# Patient Record
Sex: Male | Born: 1975 | Race: Black or African American | Hispanic: No | Marital: Single | Smoking: Never smoker
Health system: Southern US, Community
[De-identification: ages and names within clinical notes are randomized; demographics above are authoritative.]

## PROBLEM LIST (undated history)

## (undated) DIAGNOSIS — K219 Gastro-esophageal reflux disease without esophagitis: Secondary | ICD-10-CM

## (undated) DIAGNOSIS — A749 Chlamydial infection, unspecified: Secondary | ICD-10-CM

## (undated) DIAGNOSIS — T7840XA Allergy, unspecified, initial encounter: Secondary | ICD-10-CM

## (undated) DIAGNOSIS — J189 Pneumonia, unspecified organism: Secondary | ICD-10-CM

## (undated) HISTORY — DX: Allergy, unspecified, initial encounter: T78.40XA

## (undated) HISTORY — DX: Pneumonia, unspecified organism: J18.9

## (undated) HISTORY — DX: Gastro-esophageal reflux disease without esophagitis: K21.9

---

## 2009-02-03 ENCOUNTER — Emergency Department (HOSPITAL_COMMUNITY): Admission: EM | Admit: 2009-02-03 | Discharge: 2009-02-03 | Payer: Self-pay | Admitting: Family Medicine

## 2009-04-01 ENCOUNTER — Emergency Department (HOSPITAL_COMMUNITY): Admission: EM | Admit: 2009-04-01 | Discharge: 2009-04-01 | Payer: Self-pay | Admitting: Family Medicine

## 2010-03-09 ENCOUNTER — Emergency Department (HOSPITAL_COMMUNITY): Admission: EM | Admit: 2010-03-09 | Discharge: 2010-03-09 | Payer: Self-pay | Admitting: Emergency Medicine

## 2010-03-12 ENCOUNTER — Emergency Department (HOSPITAL_COMMUNITY): Admission: EM | Admit: 2010-03-12 | Discharge: 2010-03-12 | Payer: Self-pay | Admitting: Emergency Medicine

## 2010-03-14 ENCOUNTER — Encounter (INDEPENDENT_AMBULATORY_CARE_PROVIDER_SITE_OTHER): Payer: Self-pay | Admitting: Internal Medicine

## 2010-03-14 ENCOUNTER — Ambulatory Visit: Payer: Self-pay | Admitting: Cardiothoracic Surgery

## 2010-03-14 ENCOUNTER — Inpatient Hospital Stay (HOSPITAL_COMMUNITY): Admission: EM | Admit: 2010-03-14 | Discharge: 2010-03-18 | Payer: Self-pay | Admitting: Emergency Medicine

## 2010-03-14 HISTORY — PX: THORACOSCOPY: SUR1347

## 2010-03-23 ENCOUNTER — Ambulatory Visit: Payer: Self-pay | Admitting: Family Medicine

## 2010-03-25 ENCOUNTER — Ambulatory Visit: Payer: Self-pay | Admitting: Cardiothoracic Surgery

## 2010-04-30 ENCOUNTER — Encounter: Admission: RE | Admit: 2010-04-30 | Discharge: 2010-04-30 | Payer: Self-pay | Admitting: Cardiothoracic Surgery

## 2010-04-30 ENCOUNTER — Ambulatory Visit: Payer: Self-pay | Admitting: Cardiothoracic Surgery

## 2010-05-26 ENCOUNTER — Ambulatory Visit: Payer: Self-pay | Admitting: Family Medicine

## 2010-10-04 ENCOUNTER — Encounter: Payer: Self-pay | Admitting: Cardiothoracic Surgery

## 2010-11-29 LAB — AFB CULTURE WITH SMEAR (NOT AT ARMC)
Acid Fast Smear: NONE SEEN
Acid Fast Smear: NONE SEEN

## 2010-11-29 LAB — CBC
HCT: 33.8 % — ABNORMAL LOW (ref 39.0–52.0)
HCT: 34.1 % — ABNORMAL LOW (ref 39.0–52.0)
HCT: 34.7 % — ABNORMAL LOW (ref 39.0–52.0)
HCT: 35.5 % — ABNORMAL LOW (ref 39.0–52.0)
Hemoglobin: 11.4 g/dL — ABNORMAL LOW (ref 13.0–17.0)
Hemoglobin: 11.6 g/dL — ABNORMAL LOW (ref 13.0–17.0)
Hemoglobin: 11.7 g/dL — ABNORMAL LOW (ref 13.0–17.0)
Hemoglobin: 12 g/dL — ABNORMAL LOW (ref 13.0–17.0)
MCH: 31.3 pg (ref 26.0–34.0)
MCH: 31.4 pg (ref 26.0–34.0)
MCH: 31.8 pg (ref 26.0–34.0)
MCH: 32.1 pg (ref 26.0–34.0)
MCHC: 33.7 g/dL (ref 30.0–36.0)
MCHC: 33.8 g/dL (ref 30.0–36.0)
MCHC: 34.2 g/dL (ref 30.0–36.0)
MCHC: 34.5 g/dL (ref 30.0–36.0)
MCV: 92.5 fL (ref 78.0–100.0)
MCV: 92.7 fL (ref 78.0–100.0)
MCV: 93.2 fL (ref 78.0–100.0)
MCV: 93.2 fL (ref 78.0–100.0)
MCV: 93.3 fL (ref 78.0–100.0)
Platelets: 243 10*3/uL (ref 150–400)
Platelets: 263 10*3/uL (ref 150–400)
Platelets: 276 10*3/uL (ref 150–400)
RBC: 3.63 MIL/uL — ABNORMAL LOW (ref 4.22–5.81)
RBC: 3.66 MIL/uL — ABNORMAL LOW (ref 4.22–5.81)
RBC: 3.8 MIL/uL — ABNORMAL LOW (ref 4.22–5.81)
RBC: 3.84 MIL/uL — ABNORMAL LOW (ref 4.22–5.81)
RDW: 13.3 % (ref 11.5–15.5)
RDW: 13.6 % (ref 11.5–15.5)
RDW: 13.7 % (ref 11.5–15.5)
WBC: 12.7 10*3/uL — ABNORMAL HIGH (ref 4.0–10.5)
WBC: 13.1 10*3/uL — ABNORMAL HIGH (ref 4.0–10.5)
WBC: 13.4 10*3/uL — ABNORMAL HIGH (ref 4.0–10.5)
WBC: 14.6 10*3/uL — ABNORMAL HIGH (ref 4.0–10.5)
WBC: 8.8 10*3/uL (ref 4.0–10.5)

## 2010-11-29 LAB — GRAM STAIN

## 2010-11-29 LAB — COMPREHENSIVE METABOLIC PANEL
ALT: 46 U/L (ref 0–53)
AST: 40 U/L — ABNORMAL HIGH (ref 0–37)
Albumin: 2.2 g/dL — ABNORMAL LOW (ref 3.5–5.2)
Alkaline Phosphatase: 61 U/L (ref 39–117)
BUN: 14 mg/dL (ref 6–23)
CO2: 27 mEq/L (ref 19–32)
Calcium: 8.7 mg/dL (ref 8.4–10.5)
Chloride: 109 mEq/L (ref 96–112)
Creatinine, Ser: 1.31 mg/dL (ref 0.4–1.5)
GFR calc Af Amer: >60 mL/min (ref >60)
GFR calc non Af Amer: >60 mL/min (ref >60)
Glucose, Bld: 141 mg/dL — ABNORMAL HIGH (ref 70–99)
Potassium: 3.9 mEq/L (ref 3.5–5.1)
Sodium: 141 mEq/L (ref 135–145)
Total Bilirubin: 0.3 mg/dL (ref 0.3–1.2)
Total Protein: 6.7 g/dL (ref 6.0–8.3)

## 2010-11-29 LAB — BASIC METABOLIC PANEL
BUN: 11 mg/dL (ref 6–23)
BUN: 11 mg/dL (ref 6–23)
CO2: 25 mEq/L (ref 19–32)
CO2: 26 mEq/L (ref 19–32)
Calcium: 8.7 mg/dL (ref 8.4–10.5)
Calcium: 9.1 mg/dL (ref 8.4–10.5)
Chloride: 102 mEq/L (ref 96–112)
Chloride: 105 mEq/L (ref 96–112)
Chloride: 110 mEq/L (ref 96–112)
Creatinine, Ser: 1.18 mg/dL (ref 0.4–1.5)
Creatinine, Ser: 1.28 mg/dL (ref 0.4–1.5)
GFR calc Af Amer: >60 mL/min (ref >60)
GFR calc Af Amer: >60 mL/min (ref >60)
GFR calc non Af Amer: >60 mL/min (ref >60)
GFR calc non Af Amer: >60 mL/min (ref >60)
GFR calc non Af Amer: >60 mL/min (ref >60)
Glucose, Bld: 102 mg/dL — ABNORMAL HIGH (ref 70–99)
Glucose, Bld: 103 mg/dL — ABNORMAL HIGH (ref 70–99)
Glucose, Bld: 142 mg/dL — ABNORMAL HIGH (ref 70–99)
Potassium: 4 mEq/L (ref 3.5–5.1)
Potassium: 4 mEq/L (ref 3.5–5.1)
Potassium: 4.2 mEq/L (ref 3.5–5.1)
Sodium: 135 mEq/L (ref 135–145)
Sodium: 137 mEq/L (ref 135–145)
Sodium: 139 mEq/L (ref 135–145)

## 2010-11-29 LAB — URINALYSIS, ROUTINE W REFLEX MICROSCOPIC
Glucose, UA: NEGATIVE mg/dL
Hgb urine dipstick: NEGATIVE
Specific Gravity, Urine: 1.041 — ABNORMAL HIGH (ref 1.005–1.030)
pH: 5.5 (ref 5.0–8.0)

## 2010-11-29 LAB — TISSUE CULTURE
Culture: NO GROWTH
Culture: NO GROWTH

## 2010-11-29 LAB — FUNGUS CULTURE W SMEAR: Fungal Smear: NONE SEEN

## 2010-11-29 LAB — DIFFERENTIAL
Basophils Absolute: 0 10*3/uL (ref 0.0–0.1)
Eosinophils Relative: 3 % (ref 0–5)
Lymphocytes Relative: 11 % — ABNORMAL LOW (ref 12–46)
Lymphocytes Relative: 13 % (ref 12–46)
Lymphs Abs: 1.1 10*3/uL (ref 0.7–4.0)
Monocytes Absolute: 1.1 10*3/uL — ABNORMAL HIGH (ref 0.1–1.0)
Monocytes Relative: 9 % (ref 3–12)
Monocytes Relative: 9 % (ref 3–12)
Neutro Abs: 6.4 10*3/uL (ref 1.7–7.7)
Neutro Abs: 9.8 10*3/uL — ABNORMAL HIGH (ref 1.7–7.7)

## 2010-11-29 LAB — POCT I-STAT 3, ART BLOOD GAS (G3+)
Acid-Base Excess: 2 mmol/L (ref 0.0–2.0)
Bicarbonate: 26.8 mEq/L — ABNORMAL HIGH (ref 20.0–24.0)
O2 Saturation: 92 %
Patient temperature: 98.4
pO2, Arterial: 62 mmHg — ABNORMAL LOW (ref 80.0–100.0)

## 2010-11-29 LAB — CULTURE, RESPIRATORY W GRAM STAIN

## 2010-11-29 LAB — BODY FLUID CULTURE: Culture: NO GROWTH

## 2010-11-29 LAB — TYPE AND SCREEN: ABO/RH(D): B POS

## 2010-11-29 LAB — POCT I-STAT, CHEM 8
BUN: 20 mg/dL (ref 6–23)
Chloride: 106 mEq/L (ref 96–112)
Glucose, Bld: 113 mg/dL — ABNORMAL HIGH (ref 70–99)
HCT: 39 % (ref 39.0–52.0)

## 2010-11-29 LAB — VANCOMYCIN, TROUGH: Vancomycin Tr: 9.7 ug/mL — ABNORMAL LOW (ref 10.0–20.0)

## 2011-01-26 NOTE — Assessment & Plan Note (Signed)
OFFICE VISIT   MARQUET, FAIRCLOTH  DOB:  09/23/1975                                        April 30, 2010  CHART #:  [id number]   HISTORY OF PRESENT ILLNESS:  The patient is status post video  bronchoscopy with bronchial washings with left video-assisted  thoracoscopic surgery with drainage of empyema and decortication done by  Dr. Tyrone Sage, March 14, 2010.  The patient did well postoperatively and  was ultimately discharged to home in stable condition on Augmentin as  well as Percocet.  The patient presents today for followup visit.  He  feels he is progressing well.  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  He is up ambulating with assistance.  He states he does still have some  minimal incisional pain, but is not taking any narcotics for his pain.  He denies any shortness of breath, hemoptysis, or wheezing.  He states  that he did follow up with a physician that was given to him by the  hospitalist.  The patient was not satisfied with this physician, does  not think he will be returning.   PHYSICAL EXAMINATION:  Vital Signs:  Blood pressure 122/76, pulse 62,  respirations of 16, O2 sats 98% on room air, and temperature 97.4.  Respiratory:  Clear to auscultation bilaterally.  Cardiac:  Regular rate  and rhythm.  Incisions:  All incisions are healed.   STUDIES:  The patient had a PA and lateral chest x-ray obtained today  which showed improved aeration with only mild left basilar atelectasis  remaining.  All cultures have come back negative to date.   IMPRESSION AND PLAN:  The patient is progressing well postoperatively.  He is off all antibiotics and narcotics.  He is back at work.  At this  time, the patient is instructed to continue using his incentive  spirometer and increase his activity level.  He is encouraged to  continue following up with the primary care physician he was given.  At  this point, the patient is felt to be  stable from a surgical  standpoint and we will release him from the  practice.  He is told if he has any surgical issues, he is to contact  us.  The patient is in agreement.   Sheliah Plane, MD  Electronically Signed   KMD/MEDQ  D:  04/30/2010  T:  04/30/2010  Job:  [id number]

## 2011-03-25 ENCOUNTER — Encounter: Payer: Self-pay | Admitting: Family Medicine

## 2011-06-14 ENCOUNTER — Other Ambulatory Visit: Payer: Self-pay | Admitting: Internal Medicine

## 2011-06-22 ENCOUNTER — Inpatient Hospital Stay
Admission: RE | Admit: 2011-06-22 | Discharge: 2011-06-22 | Payer: Self-pay | Source: Ambulatory Visit | Attending: Internal Medicine | Admitting: Internal Medicine

## 2011-06-22 ENCOUNTER — Other Ambulatory Visit: Payer: Self-pay

## 2011-06-24 ENCOUNTER — Ambulatory Visit
Admission: RE | Admit: 2011-06-24 | Discharge: 2011-06-24 | Disposition: A | Discharge status: Home / Self Care | Payer: 59 | Source: Ambulatory Visit | Attending: Internal Medicine | Admitting: Internal Medicine

## 2011-06-24 DIAGNOSIS — R519 Headache, unspecified: Secondary | ICD-10-CM

## 2011-06-24 MED ORDER — GADOBENATE DIMEGLUMINE 529 MG/ML IV SOLN
20.0000 mL | Freq: Once | INTRAVENOUS | Status: AC | PRN
  Administered 2011-06-24: 20 mL via INTRAVENOUS

## 2011-07-27 ENCOUNTER — Encounter: Payer: Self-pay | Admitting: Gastroenterology

## 2011-08-16 ENCOUNTER — Ambulatory Visit (INDEPENDENT_AMBULATORY_CARE_PROVIDER_SITE_OTHER): Payer: 59 | Admitting: Gastroenterology

## 2011-08-16 ENCOUNTER — Encounter: Payer: Self-pay | Admitting: Gastroenterology

## 2011-08-16 VITALS — BP 134/78 | HR 88 | Ht 72.0 in | Wt 229.0 lb

## 2011-08-16 DIAGNOSIS — K219 Gastro-esophageal reflux disease without esophagitis: Secondary | ICD-10-CM

## 2011-08-16 MED ORDER — OMEPRAZOLE MAGNESIUM 20 MG PO TBEC: 20.0000 mg | DELAYED_RELEASE_TABLET | Freq: Every day | ORAL | Status: DC

## 2011-08-16 NOTE — Progress Notes (Signed)
History of Present Illness: This is a 35 year old male who relates a several year history of frequent heartburn and indigestion. He states he took Prilosec occasionally up until about 2 years ago and since then he has needed Prilosec on a daily basis to control symptoms. He is unable to come off medication. He would like a permanent solution to his reflux problems without medication. Denies weight loss, abdominal pain, constipation, diarrhea, change in stool caliber, melena, hematochezia, nausea, vomiting, dysphagia,  chest pain.  Review of Systems: Pertinent positive and negative review of systems were noted in the above HPI section. All other review of systems were otherwise negative.  Current Medications, Allergies, Past Medical History, Past Surgical History, Family History and Social History were reviewed in [employer] record.  Physical Exam: General: Well developed , well nourished, no acute distress Head: Normocephalic and atraumatic Eyes:  sclerae anicteric, EOMI Ears: Normal auditory acuity Mouth: No deformity or lesions Neck: Supple, no masses or thyromegaly Lungs: Clear throughout to auscultation Heart: Regular rate and rhythm; no murmurs, rubs or bruits Abdomen: Soft, non tender and non distended. No masses, hepatosplenomegaly or hernias noted. Normal Bowel sounds Musculoskeletal: Symmetrical with no gross deformities  Skin: No lesions on visible extremities Pulses:  Normal pulses noted Extremities: No clubbing, cyanosis, edema or deformities noted Neurological: Alert oriented x 4, grossly nonfocal Cervical Nodes:  No significant cervical adenopathy Inguinal Nodes: No significant inguinal adenopathy Psychological:  Alert and cooperative. Normal mood and affect  Assessment and Recommendations:  1. GERD. Begin all standard anti-reflux measures, use 4 inch blocks and continue omeprazole 20 mg daily for now. He is advised that weight loss may also help to  control reflux. Further evaluation for erosive esophagitis, Barrett's esophagus, ulcer disease and other disorders with upper endoscopy. We discussed the long-term management of reflux and options for surgical treatment as well.

## 2011-08-16 NOTE — Patient Instructions (Signed)
You have been scheduled for a Upper Endoscopy. See separate instructions.  A prescription for omeprazole has been sent to the pharmacy.  Patient advised to avoid spicy, acidic, citrus, chocolate, mints, fruit and fruit juices.  Limit the intake of caffeine, alcohol and Soda.  Don't exercise too soon after eating.  Don't lie down within 3-4 hours of eating.  Elevate the head of your bed.

## 2011-09-21 ENCOUNTER — Encounter: Payer: Self-pay | Admitting: Gastroenterology

## 2011-09-21 ENCOUNTER — Ambulatory Visit (AMBULATORY_SURGERY_CENTER): Payer: 59 | Admitting: Gastroenterology

## 2011-09-21 VITALS — BP 132/54 | HR 64 | Temp 96.6°F | Resp 15 | Ht 72.0 in | Wt 229.0 lb

## 2011-09-21 DIAGNOSIS — K2970 Gastritis, unspecified, without bleeding: Secondary | ICD-10-CM

## 2011-09-21 DIAGNOSIS — K219 Gastro-esophageal reflux disease without esophagitis: Secondary | ICD-10-CM

## 2011-09-21 DIAGNOSIS — D131 Benign neoplasm of stomach: Secondary | ICD-10-CM

## 2011-09-21 MED ORDER — SODIUM CHLORIDE 0.9 % IV SOLN: 500.0000 mL | INTRAVENOUS | Status: DC

## 2011-09-21 MED ORDER — MAGIC MOUTHWASH: 10.0000 mL | Freq: Four times a day (QID) | ORAL | Status: DC

## 2011-09-21 NOTE — Progress Notes (Signed)
Patient did not experience any of the following events: a burn prior to discharge; a fall within the facility; wrong site/side/patient/procedure/implant event; or a hospital transfer or hospital admission upon discharge from the facility. (G8907) Patient did not have preoperative order for IV antibiotic SSI prophylaxis. (G8918)  

## 2011-09-21 NOTE — Op Note (Signed)
[facility] [address] [place], [place]  [address]  ENDOSCOPY PROCEDURE REPORT  PATIENT:  Douglas, Bell  MR#:  [id number] BIRTHDATE:  04/17/1976, 35 yrs. old  GENDER:  male ENDOSCOPIST:  Judie Petit T. Russella Dar, MD, [facility]  PROCEDURE DATE:  09/21/2011 PROCEDURE:  EGD with biopsy, 19147 ASA CLASS:  Class II INDICATIONS:  GERD MEDICATIONS:  MAC sedation, administered by CRNA, propofol (Diprivan) 300 mg IV TOPICAL ANESTHETIC:  none DESCRIPTION OF PROCEDURE:   After the risks benefits and alternatives of the procedure were thoroughly explained, informed consent was obtained.  The LB GIF-H180 [id number] endoscope was introduced through the mouth and advanced to the second portion of the duodenum, without limitations.  The instrument was slowly withdrawn as the mucosa was fully examined. <<PROCEDUREIMAGES>> Moderate gastritis was found in the body and the fundus of the stomach. It was erythematous and granular. Biopsies of the fundus and body of the stomach were obtained and sent to pathology. Otherwise normal stomach.  The esophagus and gastroesophageal junction were completely normal in appearance.  The duodenal bulb was normal in appearance, as was the postbulbar duodenum. Retroflexed views revealed a hiatal hernia, 4 cm.  The scope was then withdrawn from the patient and the procedure completed. COMPLICATIONS:  None  ENDOSCOPIC IMPRESSION: 1) Moderate gastritis 2) A hiatal hernia  RECOMMENDATIONS: 1) Anti-reflux regimen 2) Await pathology results 3) PPI qam 4) Magic mouthwash swish and swallow qid for 10 days  Malcolm T. Russella Dar, MD, [facility]  n. eSIGNED:   Venita Lick. Stark at 09/21/2011 11:18 AM  [place], Lum Babe, [phone]

## 2011-09-22 ENCOUNTER — Telehealth: Payer: Self-pay | Admitting: *Deleted

## 2011-09-22 NOTE — Telephone Encounter (Signed)

## 2011-09-27 ENCOUNTER — Encounter: Payer: Self-pay | Admitting: Gastroenterology

## 2012-03-30 ENCOUNTER — Ambulatory Visit (INDEPENDENT_AMBULATORY_CARE_PROVIDER_SITE_OTHER): Payer: 59 | Admitting: Physician Assistant

## 2012-03-30 VITALS — BP 132/92 | HR 88 | Temp 98.4°F | Resp 18 | Ht 71.0 in | Wt 230.0 lb

## 2012-03-30 DIAGNOSIS — K122 Cellulitis and abscess of mouth: Secondary | ICD-10-CM

## 2012-03-30 DIAGNOSIS — J029 Acute pharyngitis, unspecified: Secondary | ICD-10-CM

## 2012-03-30 LAB — POCT RAPID STREP A (OFFICE): Rapid Strep A Screen: NEGATIVE

## 2012-03-30 MED ORDER — MAGIC MOUTHWASH W/LIDOCAINE: ORAL | Status: DC

## 2012-03-30 MED ORDER — IBUPROFEN 200 MG PO TABS
800.0000 mg | ORAL_TABLET | Freq: Once | ORAL | Status: AC
  Administered 2012-03-30: 800 mg via ORAL

## 2012-03-30 NOTE — Progress Notes (Signed)
  Subjective:    Patient ID: Douglas Bell, male    DOB: 1975-11-26, 36 y.o.   MRN: [id number]  HPI  Pt presents to clinic with 2 day h/o sore throat.  Started yesterday and seems to be worse today.  No other cold symptoms.  He does have a cough but it is because his feels like something is choking/tickling him.  His throat is red and his uvula appears swollen to him.  Review of Systems  Constitutional: Negative for fever and chills.  HENT: Positive for sore throat. Negative for ear pain, congestion, rhinorrhea and postnasal drip.   Respiratory: Positive for cough.   Cardiovascular: Negative for chest pain.  Gastrointestinal: Negative for nausea, vomiting and diarrhea.       Objective:   Physical Exam  Nursing note and vitals reviewed. Constitutional: He is oriented to person, place, and time. He appears well-developed and well-nourished.  HENT:  Head: Normocephalic and atraumatic.  Right Ear: Hearing, tympanic membrane, external ear and ear canal normal.  Left Ear: Hearing, tympanic membrane, external ear and ear canal normal.  Nose: Nose normal.  Mouth/Throat: Uvula is midline and oropharynx is clear and moist. Uvula swelling (elongated and edematous) present.  Eyes: Conjunctivae are normal.  Neck: Neck supple.  Cardiovascular: Normal rate, regular rhythm and normal heart sounds.  Exam reveals no gallop and no friction rub.   No murmur heard. Pulmonary/Chest: Effort normal and breath sounds normal.  Lymphadenopathy:    He has no cervical adenopathy.  Neurological: He is alert and oriented to person, place, and time.  Skin: Skin is warm and dry.  Psychiatric: He has a normal mood and affect. His behavior is normal. Judgment and thought content normal.    Results for orders placed in visit on 03/30/12  POCT RAPID STREP A (OFFICE)      Component Value Range   Rapid Strep A Screen Negative  Negative         Assessment & Plan:   1. Acute pharyngitis  POCT rapid strep A,  ibuprofen (ADVIL,MOTRIN) tablet 800 mg  2. Uvulitis     Gave Dukes MMW for pain.

## 2012-07-25 ENCOUNTER — Ambulatory Visit (INDEPENDENT_AMBULATORY_CARE_PROVIDER_SITE_OTHER): Payer: 59 | Admitting: Family Medicine

## 2012-07-25 ENCOUNTER — Ambulatory Visit: Payer: 59

## 2012-07-25 VITALS — BP 128/88 | HR 78 | Temp 98.3°F | Resp 18 | Ht 71.0 in | Wt 233.8 lb

## 2012-07-25 DIAGNOSIS — M79601 Pain in right arm: Secondary | ICD-10-CM

## 2012-07-25 DIAGNOSIS — M79609 Pain in unspecified limb: Secondary | ICD-10-CM

## 2012-07-25 MED ORDER — MELOXICAM 7.5 MG PO TABS: 7.5000 mg | ORAL_TABLET | Freq: Every day | ORAL | Status: DC

## 2012-07-25 NOTE — Progress Notes (Signed)
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 He was lifting some heavy quit yesterday and felt burning and swelling in his right forearm, his dominant side. He's not had this before. There is no crush injury aspect of this and he's had no bruising. He has full range of motion but whenever he starts lift anything heavier than 20 or 30 pounds he gets a burning sensation and the swelling in the proximal volar right forearm.  Objective: No acute distress, full range of motion of the right arm There is some swelling in the proximal right forearm with no point tenderness. He has full range of motion of his hand and wrist. His normal sensation in the right arm. [facility] reading (PRIMARY) by  Dr. Reubin Milan right forearm  Assessment: Right forearm strain  Plan: Given note for work for light duty, Mobic 7.5 daily, recheck in 2 weeks.

## 2012-07-28 ENCOUNTER — Telehealth: Payer: Self-pay

## 2012-07-28 NOTE — Telephone Encounter (Signed)
Pt is not feeling any better and would like to let dr know so a rx can be called into the pharmacy [facility] [address] [phone]

## 2012-07-28 NOTE — Telephone Encounter (Signed)
Dr Milus Glazier, do you want to Rx something else for pt, or have other instr's for him?

## 2012-07-30 ENCOUNTER — Other Ambulatory Visit: Payer: Self-pay | Admitting: Family Medicine

## 2012-07-30 DIAGNOSIS — M79603 Pain in arm, unspecified: Secondary | ICD-10-CM

## 2012-07-30 MED ORDER — PREDNISONE 20 MG PO TABS: 40.0000 mg | ORAL_TABLET | Freq: Every day | ORAL | Status: DC

## 2012-07-30 NOTE — Telephone Encounter (Signed)
Prednisone has been called in as a second approach to remedy the arm pain

## 2012-07-31 ENCOUNTER — Telehealth: Payer: Self-pay

## 2012-07-31 NOTE — Telephone Encounter (Signed)
Called patient, he was given Meloxicam, then prednisone after calling to advise this was not helping. I have advised him to return to clinic. He will come in tomorrow am for a recheck.

## 2012-07-31 NOTE — Telephone Encounter (Signed)
Pt is wanting to talk with someone about his medication it is not working   [employer] number [phone]

## 2012-08-01 ENCOUNTER — Ambulatory Visit (INDEPENDENT_AMBULATORY_CARE_PROVIDER_SITE_OTHER): Payer: 59 | Admitting: Internal Medicine

## 2012-08-01 VITALS — BP 126/78 | HR 89 | Temp 98.7°F | Resp 16 | Ht 71.0 in | Wt 234.0 lb

## 2012-08-01 DIAGNOSIS — S46911A Strain of unspecified muscle, fascia and tendon at shoulder and upper arm level, right arm, initial encounter: Secondary | ICD-10-CM

## 2012-08-01 DIAGNOSIS — M79609 Pain in unspecified limb: Secondary | ICD-10-CM

## 2012-08-01 DIAGNOSIS — IMO0002 Reserved for concepts with insufficient information to code with codable children: Secondary | ICD-10-CM

## 2012-08-01 MED ORDER — HYDROCODONE-ACETAMINOPHEN 5-325 MG PO TABS: 1.0000 | ORAL_TABLET | Freq: Four times a day (QID) | ORAL | Status: DC | PRN

## 2012-08-01 NOTE — Progress Notes (Signed)
  Subjective:    Patient ID: Douglas Bell, male    DOB: 02-13-1976, 36 y.o.   MRN: [id number]  HPI Right forearm injury/strain. XR neg. 1 week injury, on light duty. Arm throbs at night.  Review of Systems     Objective:   Physical Exam Tender right forearm with resistance, good strength. NMSV fully intact Normal anatomy       Assessment & Plan:  [facility] carefully Further w/up if not 100% in 1 week

## 2012-08-23 ENCOUNTER — Other Ambulatory Visit: Payer: Self-pay

## 2012-08-23 MED ORDER — OMEPRAZOLE MAGNESIUM 20 MG PO TBEC: 20.0000 mg | DELAYED_RELEASE_TABLET | Freq: Every day | ORAL | Status: DC

## 2014-01-13 ENCOUNTER — Emergency Department (HOSPITAL_COMMUNITY)
Admission: EM | Admit: 2014-01-13 | Discharge: 2014-01-13 | Disposition: A | Discharge status: Home / Self Care | Payer: Managed Care, Other (non HMO) | Source: Home / Self Care | Attending: Family Medicine | Admitting: Family Medicine

## 2014-01-13 ENCOUNTER — Other Ambulatory Visit (HOSPITAL_COMMUNITY)
Admission: RE | Admit: 2014-01-13 | Discharge: 2014-01-13 | Disposition: A | Discharge status: Home / Self Care | Payer: Managed Care, Other (non HMO) | Source: Ambulatory Visit | Attending: Family Medicine | Admitting: Family Medicine

## 2014-01-13 ENCOUNTER — Encounter (HOSPITAL_COMMUNITY): Payer: Self-pay | Admitting: Emergency Medicine

## 2014-01-13 DIAGNOSIS — N342 Other urethritis: Secondary | ICD-10-CM

## 2014-01-13 DIAGNOSIS — Z113 Encounter for screening for infections with a predominantly sexual mode of transmission: Secondary | ICD-10-CM | POA: Insufficient documentation

## 2014-01-13 HISTORY — DX: Chlamydial infection, unspecified: A74.9

## 2014-01-13 MED ORDER — AZITHROMYCIN 250 MG PO TABS
1000.0000 mg | ORAL_TABLET | Freq: Once | ORAL | Status: AC
  Administered 2014-01-13: 1000 mg via ORAL

## 2014-01-13 MED ORDER — CEFTRIAXONE SODIUM 250 MG IJ SOLR
INTRAMUSCULAR | Status: AC
  Filled 2014-01-13: qty 250

## 2014-01-13 MED ORDER — LIDOCAINE HCL (PF) 1 % IJ SOLN
INTRAMUSCULAR | Status: AC
  Filled 2014-01-13: qty 5

## 2014-01-13 MED ORDER — CEFTRIAXONE SODIUM 250 MG IJ SOLR
250.0000 mg | Freq: Once | INTRAMUSCULAR | Status: AC
  Administered 2014-01-13: 250 mg via INTRAMUSCULAR

## 2014-01-13 MED ORDER — AZITHROMYCIN 250 MG PO TABS
ORAL_TABLET | ORAL | Status: AC
  Filled 2014-01-13: qty 4

## 2014-01-13 NOTE — Discharge Instructions (Signed)
Thank you for coming in today. I recommend HIV and syphilis testing at the health department in the near future.  Urethritis, Adult Urethritis is an inflammation of the tube through which urine exits your bladder (urethra).  CAUSES Urethritis is often caused by an infection in your urethra. The infection can be viral, like herpes. The infection can also be bacterial, like gonorrhea. RISK FACTORS Risk factors of urethritis include:  Having sex without using a condom.  Having multiple sexual partners.  Having poor hygiene. SIGNS AND SYMPTOMS Symptoms of urethritis are less noticeable in women than in men. These symptoms include:  Burning feeling when you urinate (dysuria).  Discharge from your urethra.  Blood in your urine (hematuria).  Urinating more than usual. DIAGNOSIS  To confirm a diagnosis of urethritis, your health care provider will do the following:  Ask about your sexual history.  Perform a physical exam.  Have you provide a sample of your urine for lab testing.  Use a cotton swab to gently collect a sample from your urethra for lab testing. TREATMENT  It is important to treat urethritis. Depending on the cause, untreated urethritis may lead to serious genital infections and possibly infertility. Urethritis caused by a bacterial infection is treated with antibiotics. All sexual partners must be treated.  HOME CARE INSTRUCTIONS  Do not have sex until the test results are known and treatment is completed, even if your symptoms go away before you finish treatment.  Finish all medicines that you are prescribed. SEEK MEDICAL CARE IF:   Your symptoms are not improved in 3 days.  Your symptoms are getting worse.  You develop abdominal pain or pelvic pain (in women).  You develop joint pain. SEEK IMMEDIATE MEDICAL CARE IF:   You have a fever with a temperature of 101.40F (38.8C) or greater.  You have severe pain in the belly, back, or side.  You have  repeated vomiting. Document Released: 02/23/2001 Document Revised: 06/20/2013 Document Reviewed: 04/30/2013 [facility] Patient Information 2014 [place].

## 2014-01-13 NOTE — ED Notes (Signed)
No S/S allergic reaction. 

## 2014-01-13 NOTE — ED Provider Notes (Signed)
Douglas Bell is a 38 y.o. male who presents to Urgent Care today for penile discharge. Patient was exposed to an STD about a week ago when a condom broke. He has penile discharge started a few days ago. He has some pain with urination. He denies any fevers chills nausea vomiting or diarrhea. Symptoms consistent with prior episodes of Chlamydia.   Past Medical History  Diagnosis Date  . Allergy     RHINITIS  . Pneumonia   . GERD (gastroesophageal reflux disease)   . Chlamydia    History  Substance Use Topics  . Smoking status: Never Smoker   . Smokeless tobacco: Never Used     Comment: Cigar on occ per patient   . Alcohol Use: No   ROS as above Medications: Current Facility-Administered Medications  Medication Dose Route Frequency Provider Last Rate Last Dose  . azithromycin (ZITHROMAX) tablet 1,000 mg  1,000 mg Oral Once Gregor Hams, MD      . cefTRIAXone (ROCEPHIN) injection 250 mg  250 mg Intramuscular Once Gregor Hams, MD       Current Outpatient Prescriptions  Medication Sig Dispense Refill  . Alum & Mag Hydroxide-Simeth (MAGIC MOUTHWASH W/LIDOCAINE) SOLN 1 tsp po q1-2h prn sore throat - 1:1 mixture with lidocaine  120 mL  0  . HYDROcodone-acetaminophen (NORCO/VICODIN) 5-325 MG per tablet Take 1 tablet by mouth every 6 (six) hours as needed for pain.  30 tablet  0  . meloxicam (MOBIC) 7.5 MG tablet Take 1 tablet (7.5 mg total) by mouth daily.  30 tablet  0  . omeprazole (PRILOSEC OTC) 20 MG tablet Take 1 tablet (20 mg total) by mouth daily.  30 tablet  0  . predniSONE (DELTASONE) 20 MG tablet Take 2 tablets (40 mg total) by mouth daily.  10 tablet  0    Exam:  BP 144/96  Pulse 93  Temp(Src) 99.7 F (37.6 C) (Oral)  Resp 18  SpO2 100% Gen: Well NAD Genitals: No inguinal lymphadenopathy. Penis without lesions normal-appearing with small amount of clear discharge present. Testicles are descended bilaterally and nontender without masses palpated.   No results found for  this or any previous visit (from the past 24 hour(s)). No results found.  Assessment and Plan: 38 y.o. male with urethritis. Empiric treatment with ceftriaxone and azithromycin. Cytology pending. Recommend HIV and RPR at health Department in the near future  Discussed warning signs or symptoms. Please see discharge instructions. Patient expresses understanding.    Gregor Hams, MD 01/13/14 [phone]

## 2014-01-13 NOTE — ED Notes (Signed)
Reports having unprotected intercourse while out of town last weekend.  Started with testicular pain 3 days ago and penile discharge 1-2 days ago.

## 2014-01-14 LAB — URINE CYTOLOGY ANCILLARY ONLY
Chlamydia: NEGATIVE
NEISSERIA GONORRHEA: NEGATIVE
Trichomonas: NEGATIVE

## 2014-01-17 ENCOUNTER — Emergency Department (INDEPENDENT_AMBULATORY_CARE_PROVIDER_SITE_OTHER)
Admission: EM | Admit: 2014-01-17 | Discharge: 2014-01-17 | Disposition: A | Discharge status: Home / Self Care | Payer: Managed Care, Other (non HMO) | Source: Home / Self Care | Attending: Family Medicine | Admitting: Family Medicine

## 2014-01-17 ENCOUNTER — Encounter (HOSPITAL_COMMUNITY): Payer: Self-pay | Admitting: Emergency Medicine

## 2014-01-17 DIAGNOSIS — R300 Dysuria: Secondary | ICD-10-CM

## 2014-01-17 LAB — GLUCOSE, CAPILLARY: Glucose-Capillary: 135 mg/dL — ABNORMAL HIGH (ref 70–99)

## 2014-01-17 NOTE — ED Notes (Signed)
Pt  Reports  Symptoms    Of  Burning  On  Urination       Seen    Last  Sunday  And  Was  Given  meds      Pt   Reports  Still  Burns  When  Urinates

## 2014-01-17 NOTE — ED Provider Notes (Signed)
CSN: [id number]     Arrival date & time 01/17/14  5361 History   First MD Initiated Contact with Patient 01/17/14 [phone]     Chief Complaint  Patient presents with  . Follow-up   (Consider location/radiation/quality/duration/timing/severity/associated sxs/prior Treatment) HPI Comments: Was seen for same 01-13-2014 and treated with 1 gram azithromycin and 250mg  of ceftriaxone. Denies any new, additional or associated symptoms. Denies polyuria, hematuria, polydipsia or genital lesions.  PCP: none  Patient is a 38 y.o. male presenting with dysuria. The history is provided by the patient.  Dysuria This is a new problem. Episode onset: 5 days. The problem occurs daily. The problem has not changed since onset.Associated symptoms comments: none.    Past Medical History  Diagnosis Date  . Allergy     RHINITIS  . Pneumonia   . GERD (gastroesophageal reflux disease)   . Chlamydia    Past Surgical History  Procedure Laterality Date  . Thoracoscopy  03/14/2010   Family History  Problem Relation Age of Onset  . Colon cancer Neg Hx    History  Substance Use Topics  . Smoking status: Never Smoker   . Smokeless tobacco: Never Used     Comment: Cigar on occ per patient   . Alcohol Use: No    Review of Systems  Genitourinary: Positive for dysuria.  All other systems reviewed and are negative.   Allergies  Review of patient's allergies indicates no known allergies.  Home Medications   Prior to Admission medications   Not on File   BP 138/86  Pulse 78  Temp(Src) 98.6 F (37 C) (Oral)  Resp 14  SpO2 100% Physical Exam  Nursing note and vitals reviewed. Constitutional: He is oriented to person, place, and time. He appears well-developed and well-nourished.  HENT:  Head: Normocephalic and atraumatic.  Eyes: Conjunctivae are normal.  Cardiovascular: Normal rate, regular rhythm and normal heart sounds.   Pulmonary/Chest: Effort normal and breath sounds normal.  Abdominal: Soft.  Bowel sounds are normal. He exhibits no distension. There is no tenderness. There is no CVA tenderness.  Genitourinary:  Declined examination of his genitalia  Musculoskeletal: Normal range of motion.  Neurological: He is alert and oriented to person, place, and time.  Skin: Skin is warm and dry. No rash noted.  Psychiatric: He has a normal mood and affect. His behavior is normal.    ED Course  Procedures (including critical care time) Labs Review Labs Reviewed  GLUCOSE, CAPILLARY - Abnormal; Notable for the following:    Glucose-Capillary 135 (*)    All other components within normal limits  URINE CULTURE    Imaging Review No results found.   MDM   1. Dysuria   CBG 135 Urine culture sent Results reviewed from 01/13/2014 visit and patient's urine cytology negative for Gonorrhea, Chlamydia and Trichomonas. Was treated empirically with azithromycin and ceftriaxone at time of visit on 01-13-2014.  Advised patient that if symptoms persist, he may wish to consider follow up evaluation with urologist. Referred to Dr. Janice Norrie.    [place], [place] 01/17/14 [phone]

## 2014-01-17 NOTE — Discharge Instructions (Signed)
If symptoms persist, please follow up with the urologist listed on your discharge paperwork.  Dysuria Dysuria is the medical term for pain with urination. There are many causes for dysuria, but urinary tract infection is the most common. If a urinalysis was performed it can show that there is a urinary tract infection. A urine culture confirms that you or your child is sick. You will need to follow up with a healthcare provider because:  If a urine culture was done you will need to know the culture results and treatment recommendations.  If the urine culture was positive, you or your child will need to be put on antibiotics or know if the antibiotics prescribed are the right antibiotics for your urinary tract infection.  If the urine culture is negative (no urinary tract infection), then other causes may need to be explored or antibiotics need to be stopped. Today laboratory work may have been done and there does not seem to be an infection. If cultures were done they will take at least 24 to 48 hours to be completed. Today x-rays may have been taken and they read as normal. No cause can be found for the problems. The x-rays may be re-read by a radiologist and you will be contacted if additional findings are made. You or your child may have been put on medications to help with this problem until you can see your primary caregiver. If the problems get better, see your primary caregiver if the problems return. If you were given antibiotics (medications which kill germs), take all of the mediations as directed for the full course of treatment.  If laboratory work was done, you need to find the results. Leave a telephone number where you can be reached. If this is not possible, make sure you find out how you are to get test results. HOME CARE INSTRUCTIONS   Drink lots of fluids. For adults, drink eight, 8 ounce glasses of clear juice or water a day. For children, replace fluids as suggested by your  caregiver.  Empty the bladder often. Avoid holding urine for long periods of time.  After a bowel movement, women should cleanse front to back, using each tissue only once.  Empty your bladder before and after sexual intercourse.  Take all the medicine given to you until it is gone. You may feel better in a few days, but TAKE ALL MEDICINE.  Avoid caffeine, tea, alcohol and carbonated beverages, because they tend to irritate the bladder.  In men, alcohol may irritate the prostate.  Only take over-the-counter or prescription medicines for pain, discomfort, or fever as directed by your caregiver.  If your caregiver has given you a follow-up appointment, it is very important to keep that appointment. Not keeping the appointment could result in a chronic or permanent injury, pain, and disability. If there is any problem keeping the appointment, you must call back to this facility for assistance. SEEK IMMEDIATE MEDICAL CARE IF:   Back pain develops.  A fever develops.  There is nausea (feeling sick to your stomach) or vomiting (throwing up).  Problems are no better with medications or are getting worse. MAKE SURE YOU:   Understand these instructions.  Will watch your condition.  Will get help right away if you are not doing well or get worse. Document Released: 05/28/2004 Document Revised: 11/22/2011 Document Reviewed: 04/04/2008 [facility] Patient Information 2014 [place].

## 2014-01-18 LAB — URINE CULTURE
CULTURE: NO GROWTH
Colony Count: NO GROWTH

## 2014-01-18 NOTE — ED Provider Notes (Signed)
Medical screening examination/treatment/procedure(s) were performed by a resident physician or non-physician practitioner and as the supervising physician I was immediately available for consultation/collaboration.  Lynne Leader, MD    Gregor Hams, MD 01/18/14 [phone]

## 2014-02-02 IMAGING — CR DG FOREARM 2V*R*
2 series · 2 of 2 positions shown · non-contrast
Comparison: Preliminary reading of Dr. Gable

CLINICAL DATA: Arm pain

RIGHT FOREARM - 2 VIEW

[AP]
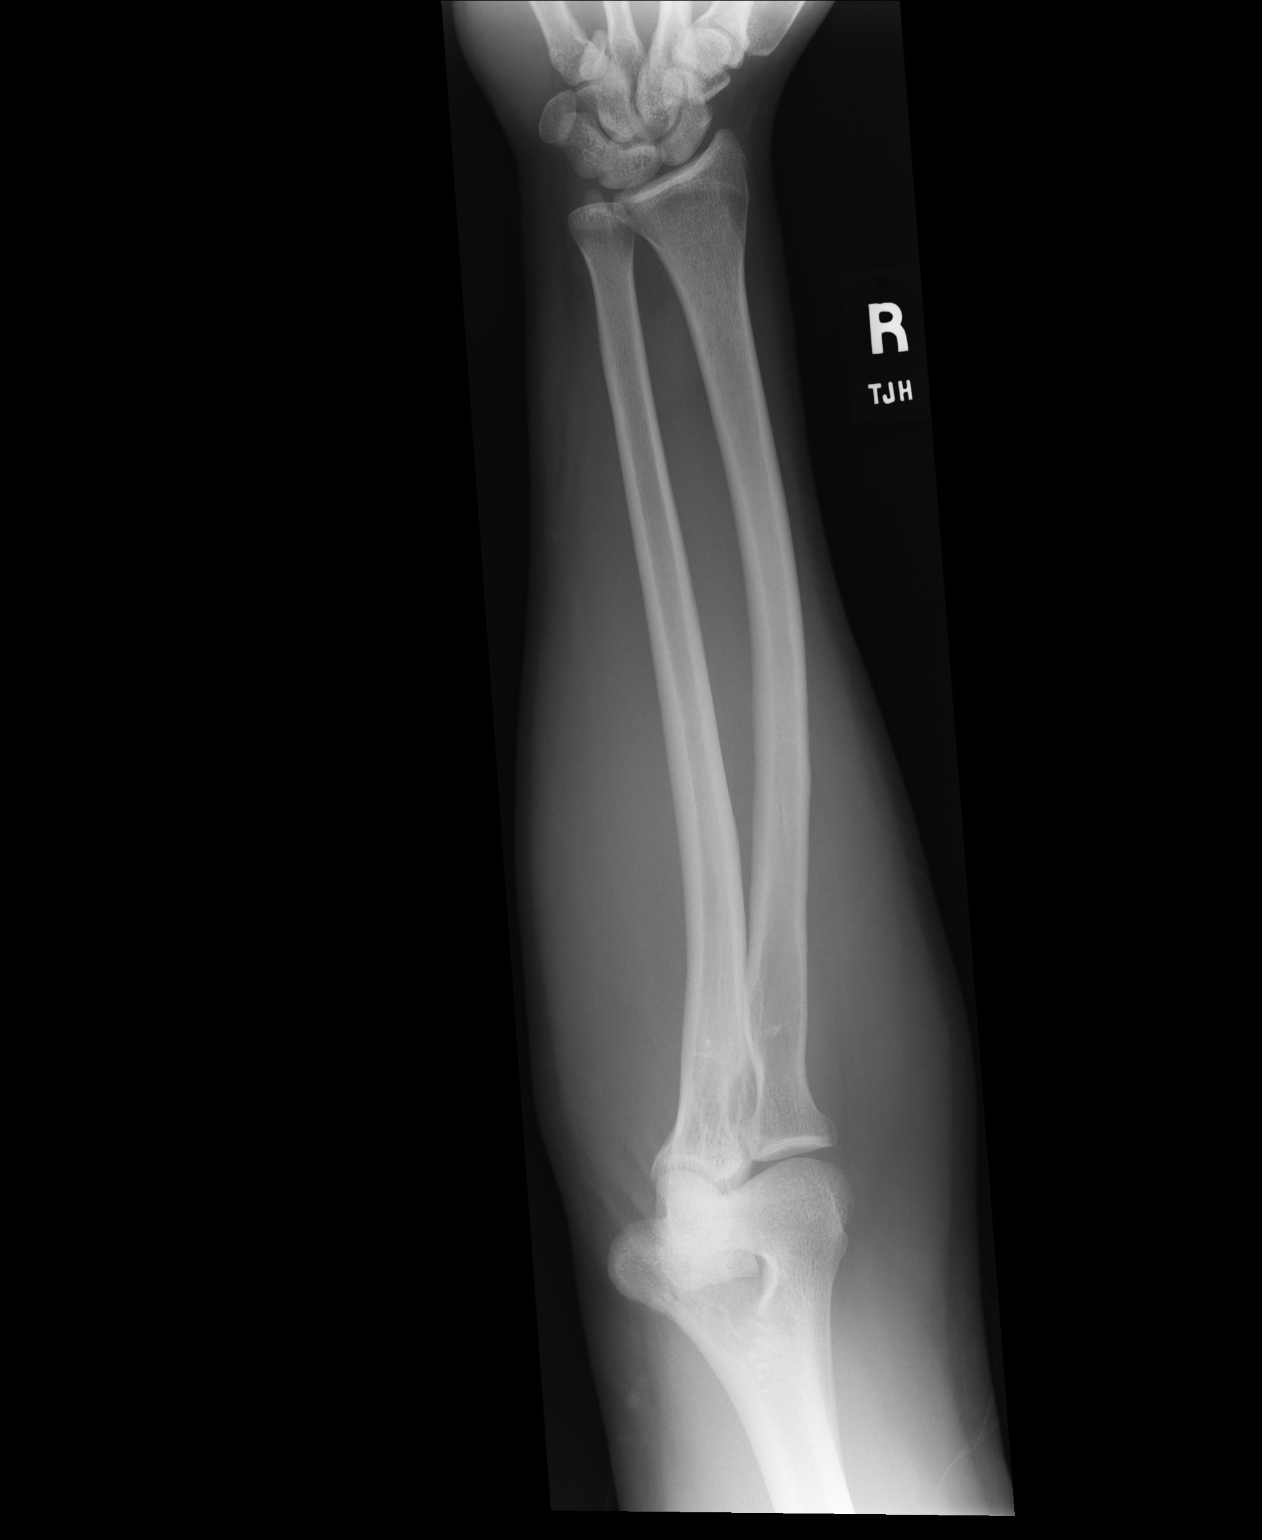

[lateral]
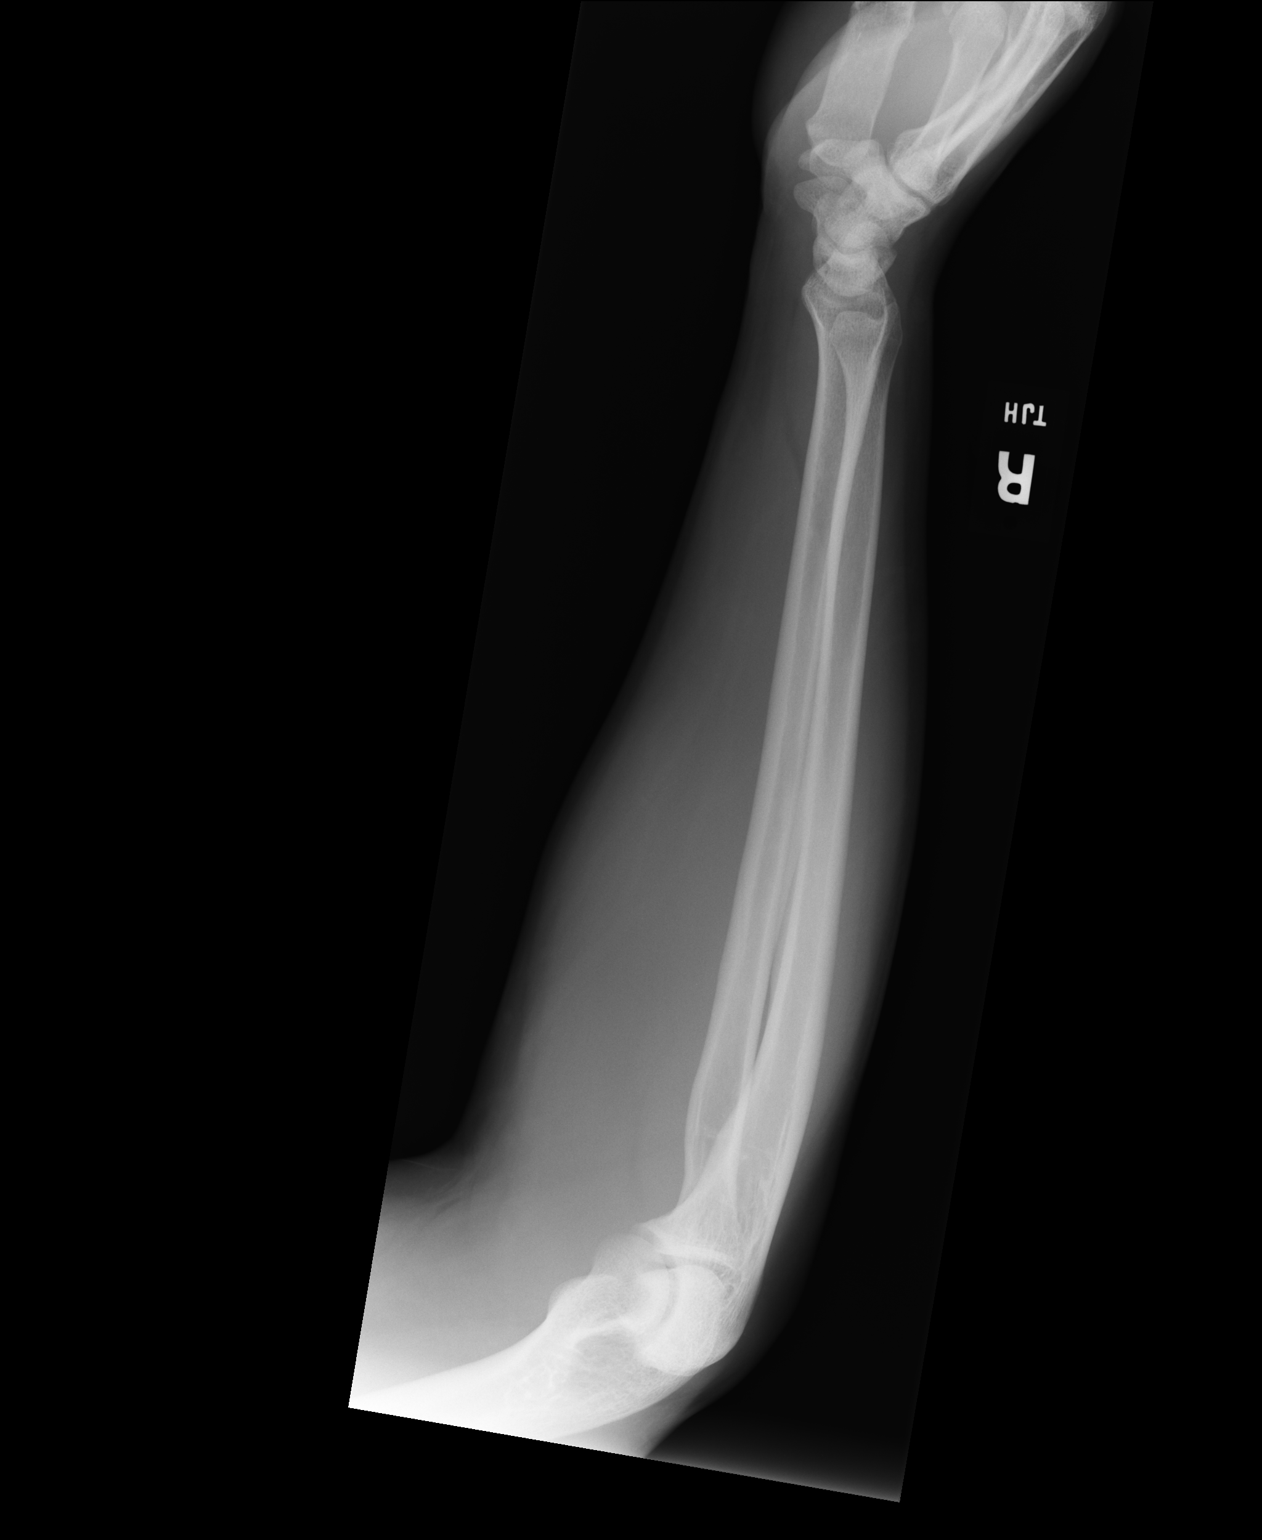

[2 of 2 positions shown; findings below may reference images not displayed]

FINDINGS: Two views of the right forearm submitted.  No acute
fracture or subluxation.  No radiopaque foreign body.
IMPRESSION: No acute fracture or subluxation.

## 2015-05-28 ENCOUNTER — Ambulatory Visit (INDEPENDENT_AMBULATORY_CARE_PROVIDER_SITE_OTHER): Payer: 59 | Admitting: Emergency Medicine

## 2015-05-28 VITALS — BP 122/82 | HR 76 | Temp 98.3°F | Resp 16 | Ht 72.0 in | Wt 241.0 lb

## 2015-05-28 DIAGNOSIS — S335XXA Sprain of ligaments of lumbar spine, initial encounter: Secondary | ICD-10-CM

## 2015-05-28 MED ORDER — NAPROXEN SODIUM 550 MG PO TABS: 550.0000 mg | ORAL_TABLET | Freq: Two times a day (BID) | ORAL | Status: AC

## 2015-05-28 MED ORDER — HYDROCODONE-ACETAMINOPHEN 5-325 MG PO TABS: 1.0000 | ORAL_TABLET | ORAL | Status: AC | PRN

## 2015-05-28 MED ORDER — KETOROLAC TROMETHAMINE 60 MG/2ML IM SOLN
60.0000 mg | Freq: Once | INTRAMUSCULAR | Status: AC
  Administered 2015-05-28: 60 mg via INTRAMUSCULAR

## 2015-05-28 MED ORDER — CYCLOBENZAPRINE HCL 10 MG PO TABS: 10.0000 mg | ORAL_TABLET | Freq: Three times a day (TID) | ORAL | Status: AC | PRN

## 2015-05-28 NOTE — Patient Instructions (Signed)

## 2015-05-28 NOTE — Progress Notes (Signed)
Subjective:  Patient ID: Douglas Bell, male    DOB: 1976-01-02  Age: 39 y.o. MRN: [id number]  CC: Tailbone Pain   HPI Douglas Bell presents  as low back pain. He has some radiation of pain into both thighs posteriorly. No numbness tingling or weakness. No defined injury. He bent over to pick up a bag of ice and developed pain. He's had no improvement with over-the-counter medication. He applied ice with no improvement. This occurred yesterday  History Douglas Bell has a past medical history of Allergy; Pneumonia; GERD (gastroesophageal reflux disease); and Chlamydia.   He has past surgical history that includes Thoracoscopy (03/14/2010).   His  family history is negative for Colon cancer.  He   reports that he has never smoked. He has never used smokeless tobacco. He reports that he does not drink alcohol or use illicit drugs.  No outpatient prescriptions prior to visit.   No facility-administered medications prior to visit.    Social History   Social History  . Marital Status: Single    Spouse Name: N/A  . Number of Children: N/A  . Years of Education: N/A   Social History Main Topics  . Smoking status: Never Smoker   . Smokeless tobacco: Never Used     Comment: Cigar on occ per patient   . Alcohol Use: No  . Drug Use: No  . Sexual Activity: Not Asked   Other Topics Concern  . None   Social History Narrative   Caffeine daily      Review of Systems  Constitutional: Negative for fever, chills and appetite change.  HENT: Negative for congestion, ear pain, postnasal drip, sinus pressure and sore throat.   Eyes: Negative for pain and redness.  Respiratory: Negative for cough, shortness of breath and wheezing.   Cardiovascular: Negative for leg swelling.  Gastrointestinal: Negative for nausea, vomiting, abdominal pain, diarrhea, constipation and blood in stool.  Endocrine: Negative for polyuria.  Genitourinary: Negative for dysuria, urgency, frequency and flank  pain.  Musculoskeletal: Positive for back pain. Negative for gait problem.  Skin: Negative for rash.  Neurological: Negative for weakness and headaches.  Psychiatric/Behavioral: Negative for confusion and decreased concentration. The patient is not nervous/anxious.     Objective:  BP 122/82 mmHg  Pulse 76  Temp(Src) 98.3 F (36.8 C) (Oral)  Resp 16  Ht 6' (1.829 m)  Wt 241 lb (109.317 kg)  BMI 32.68 kg/m2  SpO2 98%  Physical Exam  Constitutional: He is oriented to person, place, and time. He appears well-developed and well-nourished. No distress.  HENT:  Head: Normocephalic and atraumatic.  Right Ear: External ear normal.  Left Ear: External ear normal.  Nose: Nose normal.  Eyes: Conjunctivae and EOM are normal. Pupils are equal, round, and reactive to light. No scleral icterus.  Neck: Normal range of motion. Neck supple. No tracheal deviation present.  Cardiovascular: Normal rate, regular rhythm and normal heart sounds.   Pulmonary/Chest: Effort normal. No respiratory distress. He has no wheezes. He has no rales.  Abdominal: He exhibits no mass. There is no tenderness. There is no rebound and no guarding.  Musculoskeletal: He exhibits no edema.       Lumbar back: He exhibits tenderness and spasm.  Lymphadenopathy:    He has no cervical adenopathy.  Neurological: He is alert and oriented to person, place, and time. Coordination normal.  Skin: Skin is warm and dry. No rash noted.  Psychiatric: He has a normal mood and affect. His behavior is  normal.      Assessment & Plan:   Douglas Bell was seen today for tailbone pain.  Diagnoses and all orders for this visit:  Sprain of lumbar region, initial encounter -     ketorolac (TORADOL) injection 60 mg; Inject 2 mLs (60 mg total) into the muscle once.  Other orders -     naproxen sodium (ANAPROX DS) 550 MG tablet; Take 1 tablet (550 mg total) by mouth 2 (two) times daily with a meal. -     cyclobenzaprine (FLEXERIL) 10 MG  tablet; Take 1 tablet (10 mg total) by mouth 3 (three) times daily as needed for muscle spasms. -     HYDROcodone-acetaminophen (NORCO) 5-325 MG per tablet; Take 1-2 tablets by mouth every 4 (four) hours as needed.   I am having Mr. Rollo start on naproxen sodium, cyclobenzaprine, and HYDROcodone-acetaminophen. We administered ketorolac.  Meds ordered this encounter  Medications  . ketorolac (TORADOL) injection 60 mg    Sig:   . naproxen sodium (ANAPROX DS) 550 MG tablet    Sig: Take 1 tablet (550 mg total) by mouth 2 (two) times daily with a meal.    Dispense:  40 tablet    Refill:  0  . cyclobenzaprine (FLEXERIL) 10 MG tablet    Sig: Take 1 tablet (10 mg total) by mouth 3 (three) times daily as needed for muscle spasms.    Dispense:  30 tablet    Refill:  0  . HYDROcodone-acetaminophen (NORCO) 5-325 MG per tablet    Sig: Take 1-2 tablets by mouth every 4 (four) hours as needed.    Dispense:  30 tablet    Refill:  0    Appropriate red flag conditions were discussed with the patient as well as actions that should be taken.  Patient expressed his understanding.  Follow-up: Return if symptoms worsen or fail to improve.  Roselee Culver, MD
